# Patient Record
Sex: Female | Born: 1990 | Race: Black or African American | Hispanic: No | Marital: Single | Smoking: Never smoker
Health system: Southern US, Community
[De-identification: ages and names within clinical notes are randomized; demographics above are authoritative.]

---

## 2013-08-27 ENCOUNTER — Other Ambulatory Visit (HOSPITAL_COMMUNITY)
Admission: RE | Admit: 2013-08-27 | Discharge: 2013-08-27 | Disposition: A | Discharge status: Home / Self Care | Payer: BC Managed Care – PPO | Source: Ambulatory Visit | Attending: Family Medicine | Admitting: Family Medicine

## 2013-08-27 ENCOUNTER — Encounter (HOSPITAL_COMMUNITY): Payer: Self-pay | Admitting: Emergency Medicine

## 2013-08-27 ENCOUNTER — Emergency Department (HOSPITAL_COMMUNITY)
Admission: EM | Admit: 2013-08-27 | Discharge: 2013-08-27 | Disposition: A | Discharge status: Home / Self Care | Payer: BC Managed Care – PPO | Source: Home / Self Care | Attending: Family Medicine | Admitting: Family Medicine

## 2013-08-27 DIAGNOSIS — Z113 Encounter for screening for infections with a predominantly sexual mode of transmission: Secondary | ICD-10-CM | POA: Insufficient documentation

## 2013-08-27 DIAGNOSIS — K2970 Gastritis, unspecified, without bleeding: Secondary | ICD-10-CM

## 2013-08-27 DIAGNOSIS — Z202 Contact with and (suspected) exposure to infections with a predominantly sexual mode of transmission: Secondary | ICD-10-CM

## 2013-08-27 DIAGNOSIS — K2990 Gastroduodenitis, unspecified, without bleeding: Secondary | ICD-10-CM

## 2013-08-27 DIAGNOSIS — N760 Acute vaginitis: Secondary | ICD-10-CM | POA: Insufficient documentation

## 2013-08-27 LAB — POCT URINALYSIS DIP (DEVICE)
BILIRUBIN URINE: NEGATIVE
Glucose, UA: NEGATIVE mg/dL
HGB URINE DIPSTICK: NEGATIVE
KETONES UR: NEGATIVE mg/dL
Nitrite: NEGATIVE
Protein, ur: NEGATIVE mg/dL
Specific Gravity, Urine: 1.025 (ref 1.005–1.030)
Urobilinogen, UA: 1 mg/dL (ref 0.0–1.0)
pH: 6.5 (ref 5.0–8.0)

## 2013-08-27 LAB — POCT PREGNANCY, URINE: Preg Test, Ur: NEGATIVE

## 2013-08-27 LAB — RPR

## 2013-08-27 LAB — POCT H PYLORI SCREEN: H. PYLORI SCREEN, POC: NEGATIVE

## 2013-08-27 MED ORDER — GI COCKTAIL ~~LOC~~
30.0000 mL | Freq: Once | ORAL | Status: AC
  Administered 2013-08-27: 30 mL via ORAL

## 2013-08-27 MED ORDER — SUCRALFATE 1 GM/10ML PO SUSP: 1.0000 g | Freq: Three times a day (TID) | ORAL | Status: DC

## 2013-08-27 MED ORDER — OMEPRAZOLE 20 MG PO CPDR: 20.0000 mg | DELAYED_RELEASE_CAPSULE | Freq: Every day | ORAL | Status: DC

## 2013-08-27 MED ORDER — GI COCKTAIL ~~LOC~~
ORAL | Status: AC
  Filled 2013-08-27: qty 30

## 2013-08-27 NOTE — Discharge Instructions (Signed)

## 2013-08-27 NOTE — ED Notes (Signed)
Call back number verified.  

## 2013-08-27 NOTE — ED Notes (Signed)
Pt c/o abd pain onset 2 weeks Reports she was dx w/gastritis last summer; given Ranitidine abd pain is intermittent w/sharp pains Also would like to be screened for STD Partner is being treated for Chlam/Trich Alert w/no signs of acute distress.

## 2013-08-27 NOTE — ED Provider Notes (Signed)
CSN: [id number]     Arrival date & time 08/27/13  1508 History   First MD Initiated Contact with Patient 08/27/13 1639     Chief Complaint  Patient presents with  . Abdominal Pain   (Consider location/radiation/quality/duration/timing/severity/associated sxs/prior Treatment) HPI Comments: 23 year old female presents for evaluation of abdominal pain, also requesting to be screened for STDs. Her partner has tested positive for Chlamydia and Trichomonas, she says that they'll use used condoms but she just wants to make sure. She denies any symptoms. She has had the abdominal pain for about 2 weeks in the upper abdomen, radiating upward. It is burning in quality. She has been seen in the past for this and was diagnosed with gastritis, this responded well to ranitidine. She has not been taking any ranitidine recently. She says the pain has gotten worse in the past 2 weeks, keeping her awake at night at times. She had one episode of vomiting, she states that her vomit was black. She denies having any dark stools, or diarrhea. She denies chest pain or shortness of breath.   History reviewed. No pertinent past medical history. History reviewed. No pertinent past surgical history. No family history on file. History  Substance Use Topics  . Smoking status: Not on file  . Smokeless tobacco: Not on file  . Alcohol Use: Not on file   OB History   Grav Para Term Preterm Abortions TAB SAB Ect Mult Living                 Review of Systems  Constitutional: Negative for fever and chills.  HENT: Negative for congestion and sore throat.   Eyes: Negative for visual disturbance.  Respiratory: Negative for shortness of breath.   Cardiovascular: Negative for chest pain.  Gastrointestinal: Positive for nausea, vomiting and abdominal pain. Negative for diarrhea and blood in stool.  Endocrine: Negative for polydipsia and polyuria.  Genitourinary: Negative for dysuria, urgency, hematuria and vaginal discharge.   Musculoskeletal: Negative for back pain.  Skin: Negative for rash.  Allergic/Immunologic: Negative for immunocompromised state.  Neurological: Negative for light-headedness and headaches.  Hematological: Negative for adenopathy.  All other systems reviewed and are negative.   Allergies  Review of patient's allergies indicates no known allergies.  Home Medications   Prior to Admission medications   Not on File   BP 133/83  Pulse 68  Temp(Src) 98.3 F (36.8 C) (Oral)  Resp 20  SpO2 100%  LMP 08/02/2013 Physical Exam  Nursing note and vitals reviewed. Constitutional: She is oriented to person, place, and time. Vital signs are normal. She appears well-developed and well-nourished. No distress.  HENT:  Head: Normocephalic and atraumatic.  Eyes: Conjunctivae are normal.  Neck: Normal range of motion. Neck supple.  Cardiovascular: Normal rate, regular rhythm and normal heart sounds.   Pulmonary/Chest: Effort normal and breath sounds normal. No respiratory distress.  Abdominal: Soft. Bowel sounds are normal. She exhibits no distension and no mass. There is no tenderness. There is no rebound and no guarding.  Genitourinary: There is no tenderness or lesion on the right labia. There is no tenderness or lesion on the left labia. Cervix exhibits no discharge. Right adnexum displays no mass, no tenderness and no fullness. Left adnexum displays no mass, no tenderness and no fullness. No erythema, tenderness or bleeding around the vagina. Vaginal discharge (thin white) found.  Lymphadenopathy:       Right: No inguinal adenopathy present.       Left: No inguinal adenopathy present.  Neurological: She is alert and oriented to person, place, and time. She has normal strength. Coordination normal.  Skin: Skin is warm and dry. No rash noted. She is not diaphoretic.  Psychiatric: She has a normal mood and affect. Judgment normal.    ED Course  Procedures (including critical care time) Labs  Review Labs Reviewed  POCT URINALYSIS DIP (DEVICE) - Abnormal; Notable for the following:    Leukocytes, UA TRACE (*)    All other components within normal limits  CERVICOVAGINAL ANCILLARY ONLY - Abnormal; Notable for the following:    Chlamydia **CT: POSITIVE** (*)    Neisseria gonorrhea **NG: POSITIVE** (*)    All other components within normal limits  CERVICOVAGINAL ANCILLARY ONLY - Abnormal; Notable for the following:    BD affirm **Gardnerella: POSITIVE** (*)    All other components within normal limits  RPR  HIV ANTIBODY (ROUTINE TESTING)  POCT PREGNANCY, URINE  POCT H PYLORI SCREEN    Results for orders placed during the hospital encounter of 08/27/13  RPR      Result Value Ref Range   RPR NON REAC  NON REAC  HIV ANTIBODY (ROUTINE TESTING)      Result Value Ref Range   HIV 1&2 Ab, 4th Generation NONREACTIVE  NONREACTIVE  POCT URINALYSIS DIP (DEVICE)      Result Value Ref Range   Glucose, UA NEGATIVE  NEGATIVE mg/dL   Bilirubin Urine NEGATIVE  NEGATIVE   Ketones, ur NEGATIVE  NEGATIVE mg/dL   Specific Gravity, Urine 1.025  1.005 - 1.030   Hgb urine dipstick NEGATIVE  NEGATIVE   pH 6.5  5.0 - 8.0   Protein, ur NEGATIVE  NEGATIVE mg/dL   Urobilinogen, UA 1.0  0.0 - 1.0 mg/dL   Nitrite NEGATIVE  NEGATIVE   Leukocytes, UA TRACE (*) NEGATIVE  POCT PREGNANCY, URINE      Result Value Ref Range   Preg Test, Ur NEGATIVE  NEGATIVE  POCT H PYLORI SCREEN      Result Value Ref Range   H. PYLORI SCREEN, POC NEGATIVE  NEGATIVE  CERVICOVAGINAL ANCILLARY ONLY      Result Value Ref Range   Chlamydia **CT: POSITIVE** (*)    Neisseria gonorrhea **NG: POSITIVE** (*)   CERVICOVAGINAL ANCILLARY ONLY      Result Value Ref Range   BD affirm Candida: Negative     BD affirm **Gardnerella: POSITIVE** (*)    BD affirm Trichomonas: Negative     Imaging Review No results found.   MDM   1. Gastritis   2. Possible exposure to STD    POC H. pylori negative. Treating for gastritis.  Also sent swabs to check STDs, as well as HIV and RPR. Referred to gastroenterology for chronic gastritis, possible peptic ulcer disease. Followup when necessary, ED if acutely worsening   Meds ordered this encounter  Medications  . gi cocktail (Maalox,Lidocaine,Donnatal)    Sig:   . omeprazole (PRILOSEC) 20 MG capsule    Sig: Take 1 capsule (20 mg total) by mouth daily.    Dispense:  30 capsule    Refill:  2    Order Specific Question:  Supervising Provider    Answer:  Linna HoffKINDL, JAMES D [phone][[id number]]  . sucralfate (CARAFATE) 1 GM/10ML suspension    Sig: Take 10 mLs (1 g total) by mouth 4 (four) times daily -  with meals and at bedtime.    Dispense:  420 mL    Refill:  0    Order Specific Question:  Supervising Provider    Answer:  Bradd CanaryKINDL, JAMES D [phone][[id number]]  . doxycycline (VIBRAMYCIN) 100 MG capsule    Sig: Take 1 capsule (100 mg total) by mouth 2 (two) times daily.    Dispense:  14 capsule    Refill:  0    Order Specific Question:  Supervising Provider    Answer:  Clementeen GrahamOREY, EVAN, S [phone][[id number]]  . metroNIDAZOLE (FLAGYL) 500 MG tablet    Sig: Take 1 tablet (500 mg total) by mouth 2 (two) times daily.    Dispense:  14 tablet    Refill:  0    Order Specific Question:  Supervising Provider    Answer:  Clementeen GrahamOREY, EVAN, Kathie RhodesS [[id number]]       Graylon GoodZachary H Lakela Kuba, PA-C 08/27/13 1739   Labs came back positive for chlamydia, gonorrhea, gardnerella.  She will come in for shot of rocephin, sent in Rx for doxy and flagyl.    Graylon GoodZachary H Fatisha Rabalais, PA-C 08/30/13 2147

## 2013-08-28 LAB — CERVICOVAGINAL ANCILLARY ONLY
Chlamydia: POSITIVE — AB
NEISSERIA GONORRHEA: POSITIVE — AB
Wet Prep (BD Affirm): NEGATIVE
Wet Prep (BD Affirm): NEGATIVE
Wet Prep (BD Affirm): POSITIVE — AB

## 2013-08-28 LAB — HIV ANTIBODY (ROUTINE TESTING W REFLEX): HIV 1&2 Ab, 4th Generation: NONREACTIVE

## 2013-08-29 ENCOUNTER — Telehealth (HOSPITAL_COMMUNITY): Payer: Self-pay | Admitting: *Deleted

## 2013-08-29 NOTE — ED Notes (Addendum)
GC and Chlamydia pos., Affirm: Candida and Trich neg., Gardnerella pos.  Pt. needs treatment.  4/21 Message sent to Amber BallsZach Baker PA.  4/22 I called pt. Pt. verified x 2 and given results.  Pt. told she needs to come back to be treated with a Rocephin injection and pills for GC/Chlamyida and get a Rx. of Flagyl for bacterial vaginosis.  [place]. said [place] in [place] in school and can't come until Fri.  I told her the sooner the better to prevent PID.  Pt. voiced understanding. Amber Quinn 08/29/2013 Amber BallsZach Baker PA plans to treat pt. with Doxycycline and Flagyl and she will have to come in Fri. for Rocephin injection. I called pt. back and asked what pharmacy she wants her Rx.'s sent. [facility] [address] [place]. Zach notified. 08/30/2013

## 2013-08-30 MED ORDER — DOXYCYCLINE HYCLATE 100 MG PO CAPS: 100.0000 mg | ORAL_CAPSULE | Freq: Two times a day (BID) | ORAL | Status: DC

## 2013-08-30 MED ORDER — METRONIDAZOLE 500 MG PO TABS: 500.0000 mg | ORAL_TABLET | Freq: Two times a day (BID) | ORAL | Status: DC

## 2013-08-31 ENCOUNTER — Encounter (HOSPITAL_COMMUNITY): Payer: Self-pay | Admitting: Emergency Medicine

## 2013-08-31 ENCOUNTER — Emergency Department (INDEPENDENT_AMBULATORY_CARE_PROVIDER_SITE_OTHER)
Admission: EM | Admit: 2013-08-31 | Discharge: 2013-08-31 | Disposition: A | Discharge status: Home / Self Care | Payer: BC Managed Care – PPO | Source: Home / Self Care

## 2013-08-31 DIAGNOSIS — A549 Gonococcal infection, unspecified: Secondary | ICD-10-CM

## 2013-08-31 DIAGNOSIS — A5400 Gonococcal infection of lower genitourinary tract, unspecified: Secondary | ICD-10-CM

## 2013-08-31 MED ORDER — CEFTRIAXONE SODIUM 250 MG IJ SOLR
INTRAMUSCULAR | Status: AC
  Filled 2013-08-31: qty 250

## 2013-08-31 MED ORDER — CEFTRIAXONE SODIUM 250 MG IJ SOLR
250.0000 mg | Freq: Once | INTRAMUSCULAR | Status: AC
  Administered 2013-08-31: 250 mg via INTRAMUSCULAR

## 2013-08-31 MED ORDER — LIDOCAINE HCL (PF) 1 % IJ SOLN
INTRAMUSCULAR | Status: AC
  Filled 2013-08-31: qty 5

## 2013-08-31 NOTE — ED Provider Notes (Signed)
CSN: [id number]     Arrival date & time 08/31/13  0848 History   First MD Initiated Contact with Patient 08/31/13 [phone]     Chief Complaint  Patient presents with  . Exposure to STD    pt here for rocephin injection   (Consider location/radiation/quality/duration/timing/severity/associated sxs/prior Treatment) HPI Comments: Pt returns for Tx of GC as tested on positive on cytology 3 d ago. She was also positive for trich and chlamydia.  St is compliant with flagyl and doxy. No current discharges.  Patient is a 23 y.o. female presenting with STD exposure.  Exposure to STD    History reviewed. No pertinent past medical history. History reviewed. No pertinent past surgical history. History reviewed. No pertinent family history. History  Substance Use Topics  . Smoking status: Never Smoker   . Smokeless tobacco: Not on file  . Alcohol Use: No   OB History   Grav Para Term Preterm Abortions TAB SAB Ect Mult Living                 Review of Systems  All other systems reviewed and are negative.   Allergies  Review of patient's allergies indicates no known allergies.  Home Medications   Prior to Admission medications   Medication Sig Start Date End Date Taking? Authorizing Provider  doxycycline (VIBRAMYCIN) 100 MG capsule Take 1 capsule (100 mg total) by mouth 2 (two) times daily. 08/30/13  Yes Adrian BlackwaterZachary H Baker, PA-C  metroNIDAZOLE (FLAGYL) 500 MG tablet Take 1 tablet (500 mg total) by mouth 2 (two) times daily. 08/30/13  Yes Graylon GoodZachary H Baker, PA-C  omeprazole (PRILOSEC) 20 MG capsule Take 1 capsule (20 mg total) by mouth daily. 08/27/13   Adrian BlackwaterZachary H Baker, PA-C  sucralfate (CARAFATE) 1 GM/10ML suspension Take 10 mLs (1 g total) by mouth 4 (four) times daily -  with meals and at bedtime. 08/27/13   Adrian BlackwaterZachary H Baker, PA-C   BP 120/70  Pulse 60  Temp(Src) 98 F (36.7 C) (Oral)  Resp 12  SpO2 100%  LMP 08/02/2013 Physical Exam  Nursing note and vitals reviewed. Constitutional: She  is oriented to person, place, and time. She appears well-developed and well-nourished. No distress.  Neurological: She is alert and oriented to person, place, and time. She exhibits normal muscle tone.  Skin: Skin is warm and dry.  Psychiatric: She has a normal mood and affect.    ED Course  Procedures (including critical care time) Labs Review Labs Reviewed - No data to display  Imaging Review No results found.   MDM   1. Gonorrhea in female    Use condoms. Read information re STD's. Rocephin 250 mg IM now.  Finish other meds as directed.    Hayden Rasmussenavid Blaire Hodsdon, NP 08/31/13 [phone]

## 2013-08-31 NOTE — Discharge Instructions (Signed)
Gonorrhea Gonorrhea is an infection that can cause serious problems. If left untreated, may   Damage the female or female organs.   Cause women to be unable to have children (sterility).   Harm a fetus, if the infected woman is pregnant.  It is important to get treatment for gonorrhea as soon as possible. It is also necessary that all your sexual partners be tested for the infection.  CAUSES  Gonorrhea is caused by bacteria called Neisseria gonorrhoeae. The infection is spread from person to person, usually by sexual contact (such as by anal, vaginal, or oral means). A newborn can contract the infection from his or her mother during birth.  SYMPTOMS  Some people with gonorrhea do not have symptoms. Symptoms may be different in females and males.  Females The most common symptoms are:   Pain in the lower abdomen.   Fever with or without chills.  Other symptoms include:   Abnormal vaginal discharge.   Painful intercourse.   Burning or itching of the vagina or lips of the vagina.   Abnormal vaginal bleeding.   Pain when urinating.   Long-lasting (chronic) pain in the lower abdomen, especially during menstruation or intercourse.   Inability to become pregnant.   Going into premature labor.   Irritation, pain, bleeding, or discharge from the rectum. This may occur if the infection was spread by anal sex.   Sore throat or swollen neck lymph nodes. This may occur if the infection was spread by oral sex.  Males The most common symptoms are:   Discharge from the penis.   Pain or burning during urination.   Pain or swelling in the testicles. Other symptoms may include:   Irritation, pain, bleeding, or discharge from the rectum. This may occur if the infection was spread by anal sex.   Sore throat, fever, or swollen neck lymph nodes. This may occur if the infection was spread by oral sex.  DIAGNOSIS  A diagnosis is made after a physical exam is done and a  sample of discharge is examined under a microscope for the presence of the bacteria. The discharge may be taken from the urethra, cervix, throat, or rectum.  TREATMENT  Gonorrhea is treated with antibiotic medicines. It is important for treatment to begin as soon as possible. Early treatment may prevent some problems from developing.  HOME CARE INSTRUCTIONS   Only take over-the-counter or prescription medicines for pain, fever, or discomfort as directed by your health care provider.   Take antibiotics as directed. Make sure you finish them even if you start to feel better. Incomplete treatment will put you at risk for continued infection.   Do not have sex until treatment is complete or as directed by your health care provider.   Follow up with your health care provider as directed.   Not all test results are available during your visit. If your test results are not back during the visit, make an appointment with your health care provider to find out the results. Do not assume everything is normal if you have not heard from your health care provider or the medical facility. It is important for you to follow up on all of your test results.   If you test positive for gonorrhea, inform your recent sexual partners. They need to be checked for gonorrhea even if they do not have symptoms. They may need treatment, even if they test negative for gonorrhea.  SEEK MEDICAL CARE IF:   You develop any bad  reaction to the medicine you were prescribed. This may include:   A rash.   Nausea.   Vomiting.   Diarrhea.   Your symptoms do not improve after a few days of taking antibiotics.   Your symptoms get worse.   You develop increased pain, such as in the testicles (for males) or in the abdomen (for females).  SEEK IMMEDIATE MEDICAL CARE IF:  You have a fever or persistent symptoms for more than 2 3 days.   You have a fever and your symptoms suddenly get worse.  MAKE SURE YOU:     Understand these instructions.  Will watch your condition.  Will get help right away if you are not doing well or get worse. Document Released: 04/23/2000 Document Revised: 02/14/2013 Document Reviewed: 11/01/2012 [facility] Patient Information 2014 [place], [place].  Sexually Transmitted Disease A sexually transmitted disease (STD) is a disease or infection. It may be passed from person to person. It usually is passed during sex. STDs can be spread by different types of germs. These germs are bacteria, viruses, and parasites. An STD can be passed through:  Spit (saliva).  Semen.  Blood.  Mucus from the vagina.  Pee (urine). HOW CAN I LESSEN MY CHANCES OF GETTING AN STD?  Only use condoms labeled "latex," dental dams, and lubricants that wash away with water (water soluble). Do not use petroleum jelly or oils.  Get shots (vaccines) for HPV and hepatitis.  Avoid risky sex behavior that can break the skin. WHAT SHOULD I DO IF I THINK I HAVE AN STD?  See your doctor.  Tell your sex partner(s) that you have an STD. They should be tested and treated.  Do not have sex until your doctor says it is OK. WHEN SHOULD I GET HELP? Get help if:  You have bad belly (abdominal) pain.  You are a man and have puffiness (swelling) or pain in your testicles.  You are a woman and have puffiness in your vagina. MAKE SURE YOU: Sexually Transmitted Disease A sexually transmitted disease (STD) is a disease or infection that may be passed (transmitted) from person to person, usually during sexual activity. This may happen by way of saliva, semen, blood, vaginal mucus, or urine. Common STDs include:   Gonorrhea.   Chlamydia.   Syphilis.   HIV and AIDS.   Genital herpes.   Hepatitis B and C.   Trichomonas.   Human papillomavirus (HPV).   Pubic lice.   Scabies.  Mites.  Bacterial vaginosis. WHAT ARE CAUSES OF STDs? An STD may be caused by bacteria, a virus, or  parasites. STDs are often transmitted during sexual activity if one person is infected. However, they may also be transmitted through nonsexual means. STDs may be transmitted after:   Sexual intercourse with an infected person.   Sharing sex toys with an infected person.   Sharing needles with an infected person or using unclean piercing or tattoo needles.  Having intimate contact with the genitals, mouth, or rectal areas of an infected person.   Exposure to infected fluids during birth. WHAT ARE THE SIGNS AND SYMPTOMS OF STDs? Different STDs have different symptoms. Some people may not have any symptoms. If symptoms are present, they may include:   Painful or bloody urination.   Pain in the pelvis, abdomen, vagina, anus, throat, or eyes.   Skin rash, itching, irritation, growths, sores (lesions), ulcerations, or warts in the genital or anal area.  Abnormal vaginal discharge with or without bad odor.  Penile discharge in men.   Fever.   Pain or bleeding during sexual intercourse.   Swollen glands in the groin area.   Yellow skin and eyes (jaundice). This is seen with hepatitis.   Swollen testicles.  Infertility.  Sores and blisters in the mouth. HOW ARE STDs DIAGNOSED? To make a diagnosis, your health care provider may:   Take a medical history.   Perform a physical exam.   Take a sample of any discharge for examination.  Swab the throat, cervix, opening to the penis, rectum, or vagina for testing.  Test a sample of your first morning urine.   Perform blood tests.   Perform a Pap smear, if this applies.   Perform a colposcopy.   Perform a laparoscopy.  HOW ARE STDs TREATED? Treatment depends on the STD. Some STDs may be treated but not cured.   Chlamydia, gonorrhea, trichomonas, and syphilis can be cured with antibiotics.   Genital herpes, hepatitis, and HIV can be treated, but not cured, with prescribed medicines. The medicines  lessen symptoms.   Genital warts from HPV can be treated with medicine or by freezing, burning (electrocautery), or surgery. Warts may come back.   HPV cannot be cured with medicine or surgery. However, abnormal areas may be removed from the cervix, vagina, or vulva.   If your diagnosis is confirmed, your recent sexual partners need treatment. This is true even if they are symptom-free or have a negative culture or evaluation. They should not have sex until their health care providers say it is OK. HOW CAN I REDUCE MY RISK OF GETTING AN STD?  Use latex condoms, dental dams, and water-soluble lubricants during sexual activity. Do not use petroleum jelly or oils.  Get vaccinated for HPV and hepatitis. If you have not received these vaccines in the past, talk to your health care provider about whether one or both might be right for you.   Avoid risky sex practices that can break the skin.  WHAT SHOULD I DO IF I THINK I HAVE AN STD?  See your health care provider.   Inform all sexual partners. They should be tested and treated for any STDs.  Do not have sex until your health care provider says it is OK. WHEN SHOULD I GET HELP? Seek immediate medical care if:  You develop severe abdominal pain.  You are a man and notice swelling or pain in the testicles.  You are a woman and notice swelling or pain in your vagina. Document Released: 07/17/2002 Document Revised: 02/14/2013 Document Reviewed: 11/14/2012 [facility]ExitCare Patient Information 2014 [place], [place].   Understand these instructions. Document Released: 06/03/2004 Document Revised: 02/14/2013 Document Reviewed: 10/20/2012 [facility] Patient Information 2014 [place], [place].

## 2013-08-31 NOTE — ED Provider Notes (Signed)
Medical screening examination/treatment/procedure(s) were performed by resident physician or non-physician practitioner and as supervising physician I was immediately available for consultation/collaboration.   KINDL,JAMES DOUGLAS MD.   James D Kindl, MD 08/31/13 1033 

## 2013-08-31 NOTE — ED Notes (Signed)
Pt given injection.  Will discharge at 10 a.m.  Mw,cma

## 2013-08-31 NOTE — ED Notes (Signed)
Pt was informed to come in and have rocephin injection.  Pt recently tested positive for gc/ct.

## 2013-08-31 NOTE — ED Provider Notes (Signed)
Medical screening examination/treatment/procedure(s) were performed by resident physician or non-physician practitioner and as supervising physician I was immediately available for consultation/collaboration.   Barkley BrunsKINDL,JAMES DOUGLAS MD.   Linna HoffJames D Kindl, MD 08/31/13 1034

## 2013-09-05 NOTE — ED Notes (Signed)
Pt. came in for tx. 4/24.  DHHS [facility] x 2 [facility]. [facility] 09/[facility]/2013

## 2014-03-06 ENCOUNTER — Emergency Department (HOSPITAL_COMMUNITY)
Admission: EM | Admit: 2014-03-06 | Discharge: 2014-03-06 | Disposition: A | Discharge status: Home / Self Care | Payer: Medicaid Other | Attending: Emergency Medicine | Admitting: Emergency Medicine

## 2014-03-06 ENCOUNTER — Encounter (HOSPITAL_COMMUNITY): Payer: Self-pay | Admitting: Emergency Medicine

## 2014-03-06 DIAGNOSIS — Z792 Long term (current) use of antibiotics: Secondary | ICD-10-CM | POA: Insufficient documentation

## 2014-03-06 DIAGNOSIS — R1013 Epigastric pain: Secondary | ICD-10-CM | POA: Diagnosis present

## 2014-03-06 DIAGNOSIS — K2970 Gastritis, unspecified, without bleeding: Secondary | ICD-10-CM | POA: Diagnosis not present

## 2014-03-06 DIAGNOSIS — Z79899 Other long term (current) drug therapy: Secondary | ICD-10-CM | POA: Insufficient documentation

## 2014-03-06 LAB — COMPREHENSIVE METABOLIC PANEL
ALBUMIN: 3.6 g/dL (ref 3.5–5.2)
ALT: 12 U/L (ref 0–35)
ANION GAP: 13 (ref 5–15)
AST: 16 U/L (ref 0–37)
Alkaline Phosphatase: 60 U/L (ref 39–117)
BUN: 14 mg/dL (ref 6–23)
CALCIUM: 9.4 mg/dL (ref 8.4–10.5)
CO2: 22 mEq/L (ref 19–32)
CREATININE: 0.87 mg/dL (ref 0.50–1.10)
Chloride: 105 mEq/L (ref 96–112)
GFR calc Af Amer: >90 mL/min (ref >90)
Glucose, Bld: 93 mg/dL (ref 70–99)
Potassium: 4.3 mEq/L (ref 3.7–5.3)
Sodium: 140 mEq/L (ref 137–147)
Total Bilirubin: <0.2 mg/dL — ABNORMAL LOW (ref 0.3–1.2)
Total Protein: 8 g/dL (ref 6.0–8.3)

## 2014-03-06 LAB — CBC
HCT: 38.2 % (ref 36.0–46.0)
Hemoglobin: 12.9 g/dL (ref 12.0–15.0)
MCH: 31.1 pg (ref 26.0–34.0)
MCHC: 33.8 g/dL (ref 30.0–36.0)
MCV: 92 fL (ref 78.0–100.0)
Platelets: 263 10*3/uL (ref 150–400)
RBC: 4.15 MIL/uL (ref 3.87–5.11)
RDW: 12.9 % (ref 11.5–15.5)
WBC: 6.3 10*3/uL (ref 4.0–10.5)

## 2014-03-06 LAB — I-STAT BETA HCG BLOOD, ED (MC, WL, AP ONLY)

## 2014-03-06 NOTE — Discharge Instructions (Signed)
Gastritis, Adult °Gastritis is soreness and puffiness (inflammation) of the lining of the stomach. If you do not get help, gastritis can cause bleeding and sores (ulcers) in the stomach. °HOME CARE  °· Only take medicine as told by your doctor. °· If you were given antibiotic medicines, take them as told. Finish the medicines even if you start to feel better. °· Drink enough fluids to keep your pee (urine) clear or pale yellow. °· Avoid foods and drinks that make your problems worse. Foods you may want to avoid include: °¨ Caffeine or alcohol. °¨ Chocolate. °¨ Mint. °¨ Garlic and onions. °¨ Spicy foods. °¨ Citrus fruits, including oranges, lemons, or limes. °¨ Food containing tomatoes, including sauce, chili, salsa, and pizza. °¨ Fried and fatty foods. °· Eat small meals throughout the day instead of large meals. °GET HELP RIGHT AWAY IF:  °· You have black or dark red poop (stools). °· You throw up (vomit) blood. It may look like coffee grounds. °· You cannot keep fluids down. °· Your belly (abdominal) pain gets worse. °· You have a fever. °· You do not feel better after 1 week. °· You have any other questions or concerns. °MAKE SURE YOU:  °· Understand these instructions. °· Will watch your condition. °· Will get help right away if you are not doing well or get worse. °Document Released: 10/13/2007 Document Revised: 07/19/2011 Document Reviewed: 06/09/2011 °ExitCare® Patient Information ©2015 Exit[employer], LLC. This information is not intended to replace advice given to you by your health care provider. Make sure you discuss any questions you have with your health care provider. ° °

## 2014-03-06 NOTE — ED Provider Notes (Addendum)
CSN: [id number]     Arrival date & time 03/06/14  1539 History   First MD Initiated Contact with Patient 03/06/14 1656     Chief Complaint  Patient presents with  . Abdominal Pain     (Consider location/radiation/quality/duration/timing/severity/associated sxs/prior Treatment) Patient is a 23 y.o. female presenting with abdominal pain. The history is provided by the patient.  Abdominal Pain Pain location:  Epigastric Pain quality: aching, burning and cramping   Pain radiates to:  Chest Pain severity:  Moderate Onset quality:  Gradual Duration: has been intermittent since april. Timing:  Intermittent Progression:  Resolved Chronicity:  Recurrent Context: eating   Context: not alcohol use and not laxative use   Relieved by:  None tried Worsened by:  Eating (had pizza last night that made it worse) Ineffective treatments:  None tried Associated symptoms: anorexia, nausea and vomiting   Associated symptoms: no cough, no diarrhea and no fever   Associated symptoms comment:  This morning had 1 episode of dark loose stool and dark colored vomit.  One 1 episode and feels fine now. Risk factors: NSAID use   Risk factors: no alcohol abuse and no recent hospitalization     History reviewed. No pertinent past medical history. History reviewed. No pertinent past surgical history. History reviewed. No pertinent family history. History  Substance Use Topics  . Smoking status: Never Smoker   . Smokeless tobacco: Not on file  . Alcohol Use: No   OB History   Grav Para Term Preterm Abortions TAB SAB Ect Mult Living                 Review of Systems  Constitutional: Negative for fever.  Respiratory: Negative for cough.   Gastrointestinal: Positive for nausea, vomiting, abdominal pain and anorexia. Negative for diarrhea.  All other systems reviewed and are negative.     Allergies  Review of patient's allergies indicates no known allergies.  Home Medications   Prior to  Admission medications   Medication Sig Start Date End Date Taking? Authorizing Provider  doxycycline (VIBRAMYCIN) 100 MG capsule Take 1 capsule (100 mg total) by mouth 2 (two) times daily. 08/30/13  Yes Adrian BlackwaterZachary H Baker, PA-C  metroNIDAZOLE (FLAGYL) 500 MG tablet Take 1 tablet (500 mg total) by mouth 2 (two) times daily. 08/30/13  Yes Graylon GoodZachary H Baker, PA-C  norgestimate-ethinyl estradiol (ORTHO-CYCLEN,SPRINTEC,PREVIFEM) 0.25-35 MG-MCG tablet Take 1 tablet by mouth daily.   Yes Historical Provider, MD   BP 132/68  Pulse 79  Temp(Src) 98.2 F (36.8 C) (Oral)  Resp 18  SpO2 100% Physical Exam  Nursing note and vitals reviewed. Constitutional: She is oriented to person, place, and time. She appears well-developed and well-nourished. She appears distressed.  HENT:  Head: Normocephalic and atraumatic.  Eyes: EOM are normal. Pupils are equal, round, and reactive to light.  Cardiovascular: Normal rate, regular rhythm, normal heart sounds and intact distal pulses.  Exam reveals no friction rub.   No murmur heard. Pulmonary/Chest: Effort normal and breath sounds normal. She has no wheezes. She has no rales.  Abdominal: Soft. Bowel sounds are normal. She exhibits no distension. There is no tenderness. There is no rebound and no guarding.  Genitourinary: Rectum normal. Guaiac negative stool.  Musculoskeletal: Normal range of motion. She exhibits no tenderness.  No edema  Neurological: She is alert and oriented to person, place, and time. No cranial nerve deficit.  Skin: Skin is warm and dry. No rash noted.  Psychiatric: She has a normal mood and  affect. Her behavior is normal.    ED Course  Procedures (including critical care time) Labs Review Labs Reviewed  COMPREHENSIVE METABOLIC PANEL - Abnormal; Notable for the following:    Total Bilirubin <0.2 (*)    All other components within normal limits  CBC  POC OCCULT BLOOD, ED  I-STAT BETA HCG BLOOD, ED ([facility], [facility], [facility] ONLY)    Imaging Review No  results found.   EKG Interpretation   Date/Time:  Wednesday March 06 2014 15:56:12 EDT Ventricular Rate:  75 PR Interval:  140 QRS Duration: 82 QT Interval:  360 QTC Calculation: 402 R Axis:   86 Text Interpretation:  Normal sinus rhythm Normal ECG No previous tracing  Confirmed by Anitra LauthPLUNKETT  MD, Alphonzo LemmingsWHITNEY ([id number]) on 03/06/2014 4:56:17 PM      MDM   Final diagnoses:  Gastritis    Patient presenting with symptoms most consistent with gastritis versus peptic ulcer disease. She states today she had an episode of dark emesis and black stool. She denies taking Pepto-Bismol or iron supplements. Patient has no abdominal pain at this time and has normal vital signs. Her hemoglobin is normal and Hemoccult stool is negative at bedside. Patient has been diagnosed with gastritis in the past and takes a "stomach medicine" prn.  Patient has normal vital signs and is no acute distress. EKG, CMP and urine pregnancy are all negative.  Encouraged patient to start taking her stomach medication regularly and changing her diet. Also given follow-up to GI    Gwyneth SproutWhitney Iliana Hutt, MD 03/06/14 1758  Gwyneth SproutWhitney Saraya Tirey, MD 03/06/14 1806

## 2014-03-06 NOTE — ED Notes (Signed)
Pt sts she has been having abdominal pain, dark stools, and acid reflux.

## 2015-01-08 ENCOUNTER — Emergency Department (HOSPITAL_COMMUNITY)
Admission: EM | Admit: 2015-01-08 | Discharge: 2015-01-08 | Disposition: A | Discharge status: Home / Self Care | Payer: Medicaid Other | Attending: Emergency Medicine | Admitting: Emergency Medicine

## 2015-01-08 ENCOUNTER — Emergency Department (HOSPITAL_COMMUNITY): Payer: Medicaid Other

## 2015-01-08 ENCOUNTER — Encounter (HOSPITAL_COMMUNITY): Payer: Self-pay | Admitting: *Deleted

## 2015-01-08 DIAGNOSIS — S99922A Unspecified injury of left foot, initial encounter: Secondary | ICD-10-CM | POA: Diagnosis not present

## 2015-01-08 DIAGNOSIS — M79672 Pain in left foot: Secondary | ICD-10-CM

## 2015-01-08 DIAGNOSIS — W2209XA Striking against other stationary object, initial encounter: Secondary | ICD-10-CM | POA: Diagnosis not present

## 2015-01-08 DIAGNOSIS — Y990 Civilian activity done for income or pay: Secondary | ICD-10-CM | POA: Insufficient documentation

## 2015-01-08 DIAGNOSIS — Z792 Long term (current) use of antibiotics: Secondary | ICD-10-CM | POA: Diagnosis not present

## 2015-01-08 DIAGNOSIS — Z79899 Other long term (current) drug therapy: Secondary | ICD-10-CM | POA: Diagnosis not present

## 2015-01-08 DIAGNOSIS — Y9389 Activity, other specified: Secondary | ICD-10-CM | POA: Diagnosis not present

## 2015-01-08 DIAGNOSIS — Y9289 Other specified places as the place of occurrence of the external cause: Secondary | ICD-10-CM | POA: Insufficient documentation

## 2015-01-08 NOTE — ED Provider Notes (Signed)
CSN: [id number]     Arrival date & time 01/08/15  1354 History   First MD Initiated Contact with Patient 01/08/15 1358     Chief Complaint  Patient presents with  . Foot Swelling     (Consider location/radiation/quality/duration/timing/severity/associated sxs/prior Treatment) HPI Comments: Pt is a 24 yo female who presents to the ED with complaint of left foot pain, onset 3 hours. Pt reports she was at work yesterday moving things when she dropped a roll of paper on a metal bar on her left forefoot. She denies initial sxs but reports she noticed swelling to her left foot around 11am today and then started to have worsening pain with walking around campus to her classes. She describes the pain as "throbbing" and notes it radiates to her 2nd and 3rd digit. Denies any prior history or injuries or surgeries to the left foot. Pt has not taken or used anything for the pain or swelling.    History reviewed. No pertinent past medical history. History reviewed. No pertinent past surgical history. No family history on file. Social History  Substance Use Topics  . Smoking status: Never Smoker   . Smokeless tobacco: None  . Alcohol Use: No   OB History    No data available     Review of Systems  Constitutional: Negative for fever.  Musculoskeletal: Positive for joint swelling.       Swelling of left forefoot.  Skin: Negative for wound.  Neurological: Negative for weakness and numbness.      Allergies  Review of patient's allergies indicates no known allergies.  Home Medications   Prior to Admission medications   Medication Sig Start Date End Date Taking? Authorizing Provider  doxycycline (VIBRAMYCIN) 100 MG capsule Take 1 capsule (100 mg total) by mouth 2 (two) times daily. 08/30/13   Adrian Blackwater Baker, PA-C  metroNIDAZOLE (FLAGYL) 500 MG tablet Take 1 tablet (500 mg total) by mouth 2 (two) times daily. 08/30/13   Graylon Good, PA-C  norgestimate-ethinyl estradiol  (ORTHO-CYCLEN,SPRINTEC,PREVIFEM) 0.25-35 MG-MCG tablet Take 1 tablet by mouth daily.    Historical Provider, MD   BP 111/67 mmHg  Pulse 77  Temp(Src) 98.1 F (36.7 C) (Oral)  Resp 16  Ht 5\' 5"  (1.651 m)  Wt 198 lb (89.812 kg)  BMI 32.95 kg/m2  SpO2 99%  LMP 12/05/2014 Physical Exam  Constitutional: She is oriented to person, place, and time. She appears well-developed and well-nourished.  HENT:  Head: Normocephalic and atraumatic.  Eyes: Conjunctivae and EOM are normal. Right eye exhibits no discharge. Left eye exhibits no discharge. No scleral icterus.  Cardiovascular: Normal rate.   Pulmonary/Chest: Effort normal.  Musculoskeletal: She exhibits edema and tenderness.  Swelling to dorsal aspect of left forefoot, no abrasions, nocontusions, no lacerations. TTP at left forefoot. Decreased ROM of left digits due to pain. Sensation intact. 2+ distal pulses.  Neurological: She is alert and oriented to person, place, and time.  Skin: Skin is warm and dry. No erythema.  Nursing note and vitals reviewed.   ED Course  Procedures (including critical care time) Labs Review Labs Reviewed - No data to display  Imaging Review No results found. I have personally reviewed and evaluated these images and lab results as part of my medical decision-making.  Filed Vitals:   01/08/15 1541  BP: 126/61  Pulse: 95  Temp:   Resp: 18    MDM   Final diagnoses:  Foot pain, left  Foot injury, left, initial encounter  Pt presents with worsening pain and swelling to left forefoot s/p injury. Foot neurologically intact. Xray showed soft tissue swelling, no fx or dislocation. Will plant to d/c pt home. Pt advised to take tylenol for pain (pt reports history of stomach ulcers) and use RICE. Pt given contact info for ortho and advised to contact their office for follow up as needed.   Evaluation does not show pathology requring ongoing emergent intervention or admission. Pt is hemodynamically  stable and mentating appropriately. Discussed findings/results and plan with patient/guardian, who agrees with plan. All questions answered. Return precautions discussed and outpatient follow up given.      Satira Sark [place], [place] 01/08/15 1612  Benjiman Core, MD 01/09/15 [phone]

## 2015-01-08 NOTE — Discharge Instructions (Signed)
Please rest, ice and elevate your left foot to help with the pain and swelling. You may take Tylenol as prescribed over the counter for pain relief.  Please follow up with Orthopedics as needed.  Please return to the Emergency Department if symptoms worsen or new onset of fever, numbness, tingling, weakness.

## 2015-01-08 NOTE — ED Notes (Signed)
Pt reports left foot swelling. Pt states that she was lifting a lot of boxes at work and thinks that that may have caused it.

## 2015-11-06 ENCOUNTER — Emergency Department (HOSPITAL_COMMUNITY)
Admission: EM | Admit: 2015-11-06 | Discharge: 2015-11-06 | Disposition: A | Discharge status: Home / Self Care | Payer: Medicaid Other | Attending: Emergency Medicine | Admitting: Emergency Medicine

## 2015-11-06 ENCOUNTER — Encounter (HOSPITAL_COMMUNITY): Payer: Self-pay

## 2015-11-06 DIAGNOSIS — J020 Streptococcal pharyngitis: Secondary | ICD-10-CM

## 2015-11-06 LAB — RAPID STREP SCREEN (MED CTR MEBANE ONLY): Streptococcus, Group A Screen (Direct): POSITIVE — AB

## 2015-11-06 MED ORDER — PENICILLIN G BENZATHINE 1200000 UNIT/2ML IM SUSP
1.2000 10*6.[IU] | Freq: Once | INTRAMUSCULAR | Status: AC
  Administered 2015-11-06: 1.2 10*6.[IU] via INTRAMUSCULAR
  Filled 2015-11-06: qty 2

## 2015-11-06 MED ORDER — BENZONATATE 100 MG PO CAPS: 100.0000 mg | ORAL_CAPSULE | Freq: Three times a day (TID) | ORAL | Status: AC

## 2015-11-06 MED ORDER — NAPROXEN 500 MG PO TABS: 500.0000 mg | ORAL_TABLET | Freq: Two times a day (BID) | ORAL | Status: AC

## 2015-11-06 NOTE — ED Provider Notes (Signed)
CSN: [id number]     Arrival date & time 11/06/15  [id number] History   First MD Initiated Contact with Patient 11/06/15 0745     Chief Complaint  Patient presents with  . Sore Throat     (Consider location/radiation/quality/duration/timing/severity/associated sxs/prior Treatment) HPI   Amber Quinn is a 25 y.o. female, patient with no pertinent past medical history, presenting to the ED with sore throat accompanied by cough for the last two days. Pt has tried DayQuil and Chloraseptic spray for her symptoms. Pain is moderate-severe, bilateral throat, sharp/burning, nonradiating. Denies fever/chills, difficulty breathing or swallowing, N/V, or any other complaints.    History reviewed. No pertinent past medical history. History reviewed. No pertinent past surgical history. History reviewed. No pertinent family history. Social History  Substance Use Topics  . Smoking status: Never Smoker   . Smokeless tobacco: None  . Alcohol Use: No   OB History    No data available     Review of Systems  Constitutional: Negative for fever and chills.  HENT: Positive for congestion and sore throat.   Respiratory: Positive for cough. Negative for shortness of breath.   Cardiovascular: Negative for chest pain.  Gastrointestinal: Negative for nausea and vomiting.  All other systems reviewed and are negative.     Allergies  Review of patient's allergies indicates no known allergies.  Home Medications   Prior to Admission medications   Medication Sig Start Date End Date Taking? Authorizing Provider  norgestimate-ethinyl estradiol (ORTHO-CYCLEN,SPRINTEC,PREVIFEM) 0.25-35 MG-MCG tablet Take 1 tablet by mouth daily.   Yes Historical Provider, MD  phenol (CHLORASEPTIC) 1.4 % LIQD Use as directed 1 spray in the mouth or throat as needed for throat irritation / pain.   Yes Historical Provider, MD  Pseudoephedrine-APAP-DM (DAYQUIL PO) Take 2 capsules by mouth every 4 (four) hours as needed (sore throat/  cold).   Yes Historical Provider, MD  benzonatate (TESSALON) 100 MG capsule Take 1 capsule (100 mg total) by mouth every 8 (eight) hours. 11/06/15   Ieshia Hatcher C Siana Panameno, PA-C  naproxen (NAPROSYN) 500 MG tablet Take 1 tablet (500 mg total) by mouth 2 (two) times daily. 11/06/15   Dawnelle Warman C Chyrl Elwell, PA-C   BP 101/70 mmHg  Pulse 85  Temp(Src) 98.6 F (37 C) (Oral)  Resp 16  Ht 5' (1.524 m)  Wt 93.895 kg  BMI 40.43 kg/m2  SpO2 100%  LMP 11/06/2015 Physical Exam  Constitutional: She appears well-developed and well-nourished. No distress.  HENT:  Head: Normocephalic and atraumatic.  Mouth/Throat: Uvula is midline and mucous membranes are normal. Posterior oropharyngeal edema and posterior oropharyngeal erythema present.  Eyes: Conjunctivae are normal.  Neck: Normal range of motion. Neck supple.  Cardiovascular: Normal rate, regular rhythm and intact distal pulses.   Pulmonary/Chest: Effort normal and breath sounds normal. No respiratory distress.  Abdominal: Soft. There is no tenderness. There is no guarding.  Musculoskeletal: She exhibits no edema.  Lymphadenopathy:    She has no cervical adenopathy.  Neurological: She is alert.  Skin: Skin is warm and dry. She is not diaphoretic.  Psychiatric: She has a normal mood and affect. Her behavior is normal.  Nursing note and vitals reviewed.   ED Course  Procedures (including critical care time) Labs Review Labs Reviewed  RAPID STREP SCREEN (NOT [facility]) - Abnormal; Notable for the following:    Streptococcus, Group A Screen (Direct) POSITIVE (*)    All other components within normal limits    Imaging Review No results found. I have  personally reviewed and evaluated these lab results as part of my medical decision-making.   EKG Interpretation None      MDM   Final diagnoses:  Strep throat    Amber Quinn presents with cough, nasal congestion, and sore throat for the last 2 days.  Patient is nontoxic appearing and vital signs are  within normal limits. Strep is positive. Patient opted for IM penicillin. Further home care and return precautions discussed. Patient voiced understanding of these instructions and is comfortable with discharge.   Anselm PancoastShawn C Tacy Chavis, PA-C 11/06/15 1213  Marily MemosJason Mesner, MD 11/07/15 [phone]

## 2015-11-06 NOTE — ED Notes (Signed)
Pt presents with 3 day h/o cough, sneezing and nasal congestion followed by sore throat x 2 days.  Pt reports coworker has strep.

## 2015-11-06 NOTE — Discharge Instructions (Signed)
You have been seen today for a sore throat. Your strep test was positive. You were given a shot of penicillin here in the ED to treat this infection. Drink plenty of fluids and get plenty of rest. You should be drinking at least a liter of water an hour to stay hydrated. Ibuprofen or Tylenol for pain or fever. Tessalon for cough. Warm liquids or Chloraseptic spray may help soothe the sore throat. Follow up with PCP as needed. Return to ED should symptoms worsen.

## 2015-11-06 NOTE — ED Notes (Signed)
Pt instructed to await 20 minutes for possible reaction.

## 2016-12-19 IMAGING — CR DG FOOT COMPLETE 3+V*L*
3 series · 3 of 3 positions shown · non-contrast
Comparison: None.

CLINICAL DATA: Pain after lifting heavy boxes

EXAM:
LEFT FOOT - COMPLETE 3+ VIEW

[foot ap]
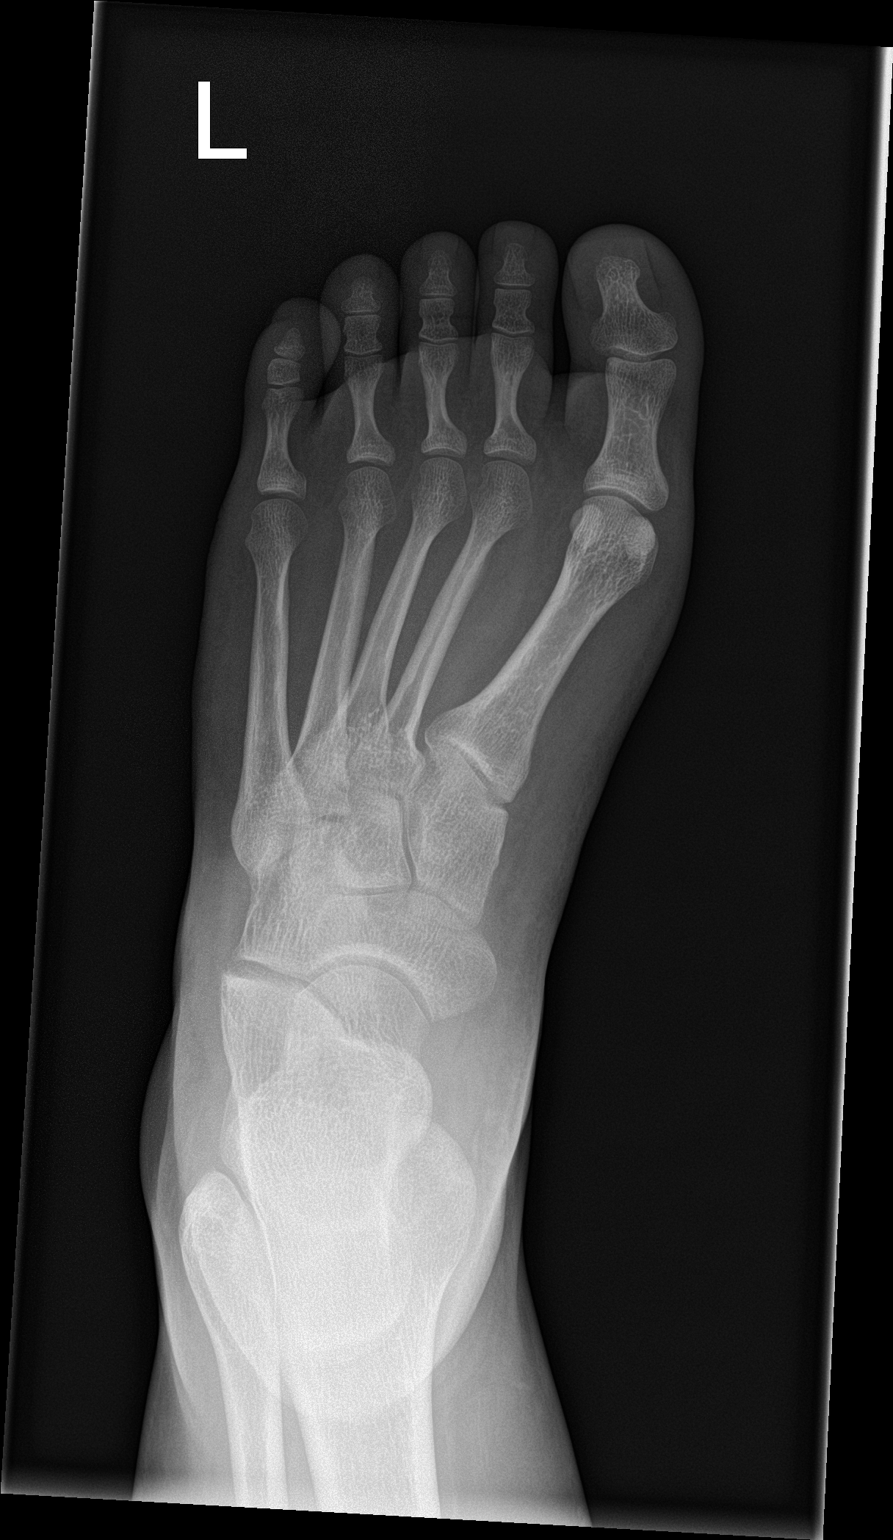

[foot obl]
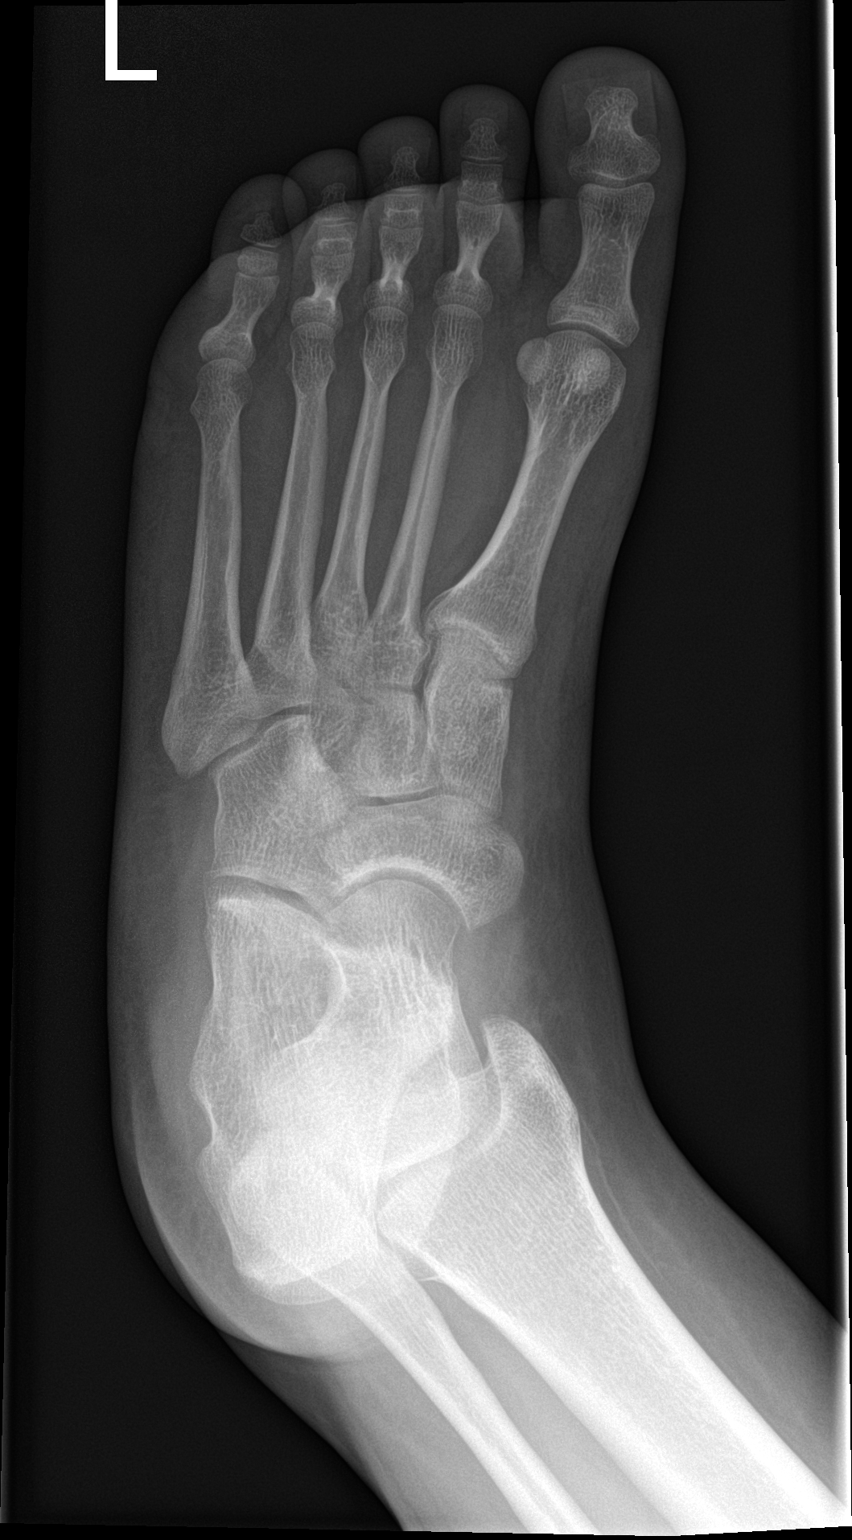

[foot lat]
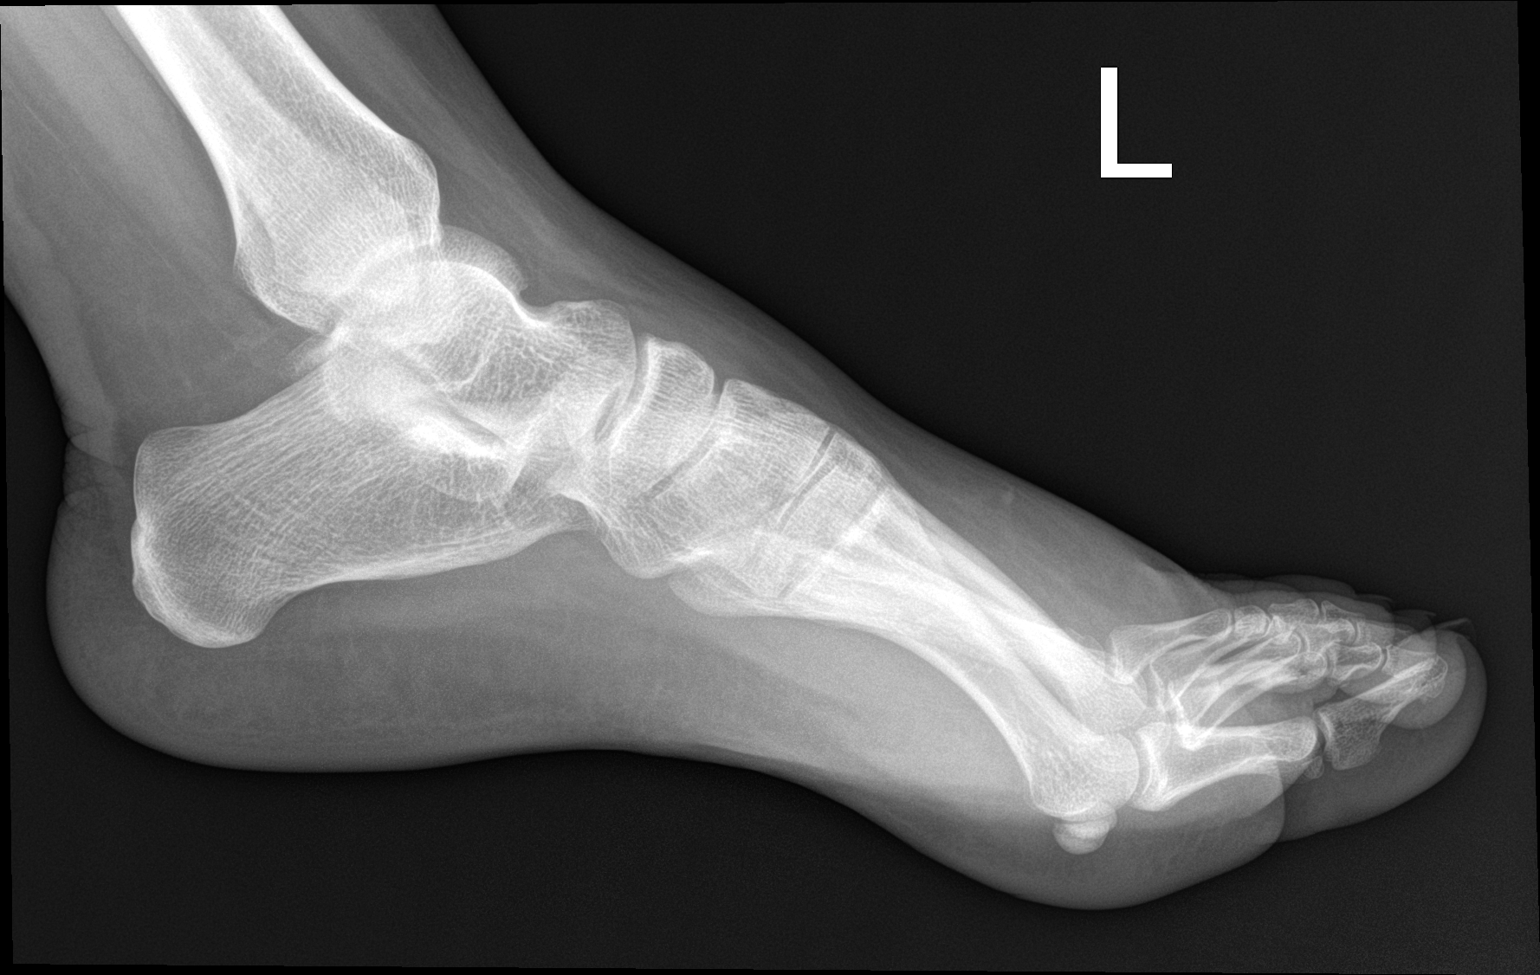

[3 of 3 positions shown; findings below may reference images not displayed]

FINDINGS: Frontal, oblique, and lateral views obtained. There is soft tissue
swelling dorsally. No fracture or dislocation. Joint spaces appear
intact. No erosive change.
IMPRESSION: Soft tissue swelling dorsally. No fracture or dislocation. No
appreciable arthropathy.
